# Patient Record
Sex: Male | Born: 1995 | Hispanic: No | Marital: Single | Smoking: Never smoker
Health system: Southern US, Community
[De-identification: ages and names within clinical notes are randomized; demographics above are authoritative.]

---

## 2016-02-15 ENCOUNTER — Encounter (HOSPITAL_COMMUNITY): Payer: Self-pay | Admitting: *Deleted

## 2016-02-15 ENCOUNTER — Emergency Department (HOSPITAL_COMMUNITY)
Admission: EM | Admit: 2016-02-15 | Discharge: 2016-02-15 | Disposition: A | Discharge status: Home / Self Care | Payer: Medicaid Other | Attending: Emergency Medicine | Admitting: Emergency Medicine

## 2016-02-15 ENCOUNTER — Emergency Department (HOSPITAL_COMMUNITY): Payer: Medicaid Other

## 2016-02-15 DIAGNOSIS — M25462 Effusion, left knee: Secondary | ICD-10-CM

## 2016-02-15 DIAGNOSIS — Y929 Unspecified place or not applicable: Secondary | ICD-10-CM | POA: Insufficient documentation

## 2016-02-15 DIAGNOSIS — W2105XA Struck by basketball, initial encounter: Secondary | ICD-10-CM | POA: Diagnosis not present

## 2016-02-15 DIAGNOSIS — Y9367 Activity, basketball: Secondary | ICD-10-CM | POA: Insufficient documentation

## 2016-02-15 DIAGNOSIS — M2392 Unspecified internal derangement of left knee: Secondary | ICD-10-CM | POA: Diagnosis not present

## 2016-02-15 DIAGNOSIS — Y999 Unspecified external cause status: Secondary | ICD-10-CM | POA: Insufficient documentation

## 2016-02-15 DIAGNOSIS — M25562 Pain in left knee: Secondary | ICD-10-CM | POA: Diagnosis present

## 2016-02-15 NOTE — ED Triage Notes (Signed)
The pt is c/o lt knee pain .  He was struck playing basketball 1300 today

## 2016-02-15 NOTE — ED Provider Notes (Signed)
[facility]-EMERGENCY DEPT Provider Note   CSN: [id number] Arrival date & time: 02/15/16  4098  By signing my name below, I, Alyssa Grove, attest that this documentation has been prepared under the direction and in the presence of Zadie Rhine, MD. Electronically Signed: Alyssa Grove, ED Scribe. 02/15/16. 4:07 AM.   History   Chief Complaint Chief Complaint  Patient presents with  . Knee Injury   Pt was playing basketball when he was struck on the lateral side of the left knee when landing. He states he felt a "shock" initially, but was able to walk home. After being seated for an extended period of time, he states left knee pain was exacerbated when attempted to flex or extend the joint. Pt reports associated throbbing left ankle pain with movement of the left ankle. Denies neck pain, back pain, LOC.    Knee Pain   This is a new problem. The current episode started 12 to 24 hours ago. The problem occurs constantly. The pain is present in the left knee. The pain is moderate. There has been a history of trauma (kneed during basketball).    PMH - none Soc hx - college student Home Medications    Prior to Admission medications   Not on File    Family History No family history on file.  Social History Social History  Substance Use Topics  . Smoking status: Never Smoker  . Smokeless tobacco: Never Used  . Alcohol use No    Allergies   Amoxicillin and Penicillins  Review of Systems Review of Systems  Musculoskeletal: Positive for arthralgias and joint swelling. Negative for back pain and neck pain.  Neurological: Negative for syncope.    Physical Exam Updated Vital Signs BP 153/88 (BP Location: Left Arm)   Pulse 70   Temp 98.4 F (36.9 C) (Oral)   Resp 16   SpO2 99%   Physical Exam CONSTITUTIONAL: Well developed/well nourished HEAD: Normocephalic/atraumatic ENMT: Mucous membranes moist NECK: supple no meningeal signs CV: S1/S2 noted, no murmurs/rubs/gallops  noted LUNGS: Lungs are clear to auscultation bilaterally, no apparent distress NEURO: Pt is awake/alert/appropriate, moves all extremitiesx4.  No facial droop.   EXTREMITIES: pulses normal/equal, full ROM, tenderness and effusion noted to left knee with difficulty in flexing left knee, no obvious step offs around the knee, distal pulses intact, no left ankle tenderness SKIN: warm, color normal PSYCH: no abnormalities of mood noted, alert and oriented to situation   ED Treatments / Results  DIAGNOSTIC STUDIES: Oxygen Saturation is 99% on RA, normal by my interpretation.    COORDINATION OF CARE: 3:59 AM Discussed treatment plan with pt at bedside which includes crutches and left knee immobilizer and pt agreed to plan.  Labs (all labs ordered are listed, but only abnormal results are displayed) Labs Reviewed - No data to display  EKG  EKG Interpretation None       Radiology Dg Knee Complete 4 Views Left  Result Date: 02/15/2016 CLINICAL DATA:  Basketball injury EXAM: LEFT KNEE - COMPLETE 4+ VIEW COMPARISON:  None. FINDINGS: There is a large suprapatellar knee effusion. No fracture or dislocation. No evidence of arthrosis. IMPRESSION: Large suprapatellar knee effusion without acute osseous abnormality. Electronically Signed   By: Deatra Robinson M.D.   On: 02/15/2016 03:39    Procedures Procedures (including critical care time) SPLINT APPLICATION Date/Time: 0430 Authorized by: Joya Gaskins Consent: Verbal consent obtained. Risks and benefits: risks, benefits and alternatives were discussed Consent given by: patient Splint applied by: orthopedic technician  Location details: left knee Splint type: immobilizer Supplies used: immobilizer Patient tolerance: Patient tolerated the procedure well with no immediate complications.    Medications Ordered in ED Medications - No data to display   Initial Impression / Assessment and Plan / ED Course  I have reviewed the triage  vital signs and the nursing notes.  Pertinent labs & imaging results that were available during my care of the patient were reviewed by me and considered in my medical decision making (see chart for details).  Clinical Course    No bony injury Suspect internal derangement Referred to ortho and sports medicine Discussed care instructions    Final Clinical Impressions(s) / ED Diagnoses   Final diagnoses:  Effusion of left knee  Internal derangement of left knee    New Prescriptions New Prescriptions   No medications on file   I personally performed the services described in this documentation, which was scribed in my presence. The recorded information has been reviewed and is accurate.        Zadie Rhineonald Imir Brumbach, MD 02/15/16 [phone]

## 2016-02-15 NOTE — Progress Notes (Signed)
**Note James-Identified via Obfuscation** Orthopedic Tech Progress Note Patient Details:  James Oneal 1996-01-18 [id number]  Ortho Devices Type of Ortho Device: Crutches, Knee Immobilizer Ortho Device/Splint Location: lue Ortho Device/Splint Interventions: Ordered, Application   Trinna PostMartinez, Jerzee Jerome J 02/15/2016, 4:34 AM

## 2016-02-15 NOTE — ED Notes (Signed)
Ortho tech will apply knee immobilizer and do crutch training

## 2016-03-17 ENCOUNTER — Encounter (HOSPITAL_COMMUNITY): Payer: Self-pay | Admitting: Emergency Medicine

## 2016-03-17 ENCOUNTER — Emergency Department (HOSPITAL_COMMUNITY)
Admission: EM | Admit: 2016-03-17 | Discharge: 2016-03-17 | Disposition: A | Discharge status: Home / Self Care | Payer: Medicaid Other | Attending: Emergency Medicine | Admitting: Emergency Medicine

## 2016-03-17 DIAGNOSIS — L0291 Cutaneous abscess, unspecified: Secondary | ICD-10-CM

## 2016-03-17 DIAGNOSIS — L02416 Cutaneous abscess of left lower limb: Secondary | ICD-10-CM | POA: Insufficient documentation

## 2016-03-17 DIAGNOSIS — L02413 Cutaneous abscess of right upper limb: Secondary | ICD-10-CM | POA: Diagnosis not present

## 2016-03-17 MED ORDER — SULFAMETHOXAZOLE-TRIMETHOPRIM 800-160 MG PO TABS: 1.0000 | ORAL_TABLET | Freq: Two times a day (BID) | ORAL | 0 refills | Status: AC

## 2016-03-17 NOTE — ED Provider Notes (Signed)
[facility]-EMERGENCY DEPT Provider Note   CSN: [id number] Arrival date & time: 03/17/16  1109  By signing my name below, I, Marnette Burgess Long, attest that this documentation has been prepared under the direction and in the presence of [address] [employer]. Electronically Signed: Marnette Burgess Long, Scribe. 03/17/2016. 12:09 PM.  History   Chief Complaint Chief Complaint  Patient presents with  . Abscess   The history is provided by the patient and medical records. No language interpreter was used.  Abscess  Location:  Shoulder/arm and pelvis Shoulder/arm abscess location:  R shoulder Pelvic abscess location:  Groin Abscess quality: draining, painful and redness   Abscess quality: no warmth   Abscess quality comment:  Right shoulder +drainage, Left groin no drainage Red streaking: no   Duration:  4 days Progression:  Unchanged Pain details:    Quality:  Aching   Severity:  Moderate   Duration:  4 days   Timing:  Constant   Progression:  Unchanged Chronicity:  New Context: skin injury (shaved)   Context: not immunosuppression   Relieved by:  None tried Worsened by:  Warm compresses Ineffective treatments:  None tried Associated symptoms: no fever, no nausea and no vomiting   Risk factors: no prior abscess     HPI Comments: James Oneal is a 21 y.o. male with no pertinent PMHx, who presents to the Emergency Department complaining of a moderate gradually worsening abscess to his R shoulder and L groin/hip x4 days. States that the area on his R shoulder is already draining but the L groin/hip one is not draining. Describes the pain on these areas as 5/10 constant, aching, non-radiating pain in the areas, worse with applying heat or palpation to the areas, and with no tx tried or alleviating factors noted. Associated symptoms includes erythema and swelling to the areas, and drainage from the R shoulder one. He has no h/o abscesses prior to the current areas of swelling.  He is not  immunosuppressed. States that he shaved and believes that could have caused them, but denies any other known injuries to the areas. He also denies warmth of the areas, red streaking, fevers, chills, CP, SOB, abd pain, N/V/D/C, hematuria, dysuria, myalgias, arthralgias, numbness, tingling, focal weakness, or any other complaints at this time.    History reviewed. No pertinent past medical history.  There are no active problems to display for this patient.   History reviewed. No pertinent surgical history.     Home Medications    Prior to Admission medications   Not on File    Family History No family history on file.  Social History Social History  Substance Use Topics  . Smoking status: Never Smoker  . Smokeless tobacco: Never Used  . Alcohol use No     Allergies   Amoxicillin and Penicillins   Review of Systems Review of Systems  Constitutional: Negative for chills and fever.  Respiratory: Negative for shortness of breath.   Cardiovascular: Negative for chest pain.  Gastrointestinal: Negative for abdominal pain, constipation, diarrhea, nausea and vomiting.  Genitourinary: Negative for dysuria and hematuria.  Musculoskeletal: Negative for arthralgias and myalgias.  Skin: Positive for color change. Negative for rash.  Allergic/Immunologic: Negative for immunocompromised state.  Neurological: Negative for weakness and numbness.  Psychiatric/Behavioral: Negative for confusion.   10 Systems reviewed and are negative for acute change except as noted in the HPI.   Physical Exam Updated Vital Signs BP 112/78 (BP Location: Left Arm)   Pulse 73  Temp 97.9 F (36.6 C) (Oral)   Resp 16   Ht 5\' 8"  (1.727 m)   Wt 145 lb (65.8 kg)   SpO2 96%   BMI 22.05 kg/m   Physical Exam  Constitutional: He is oriented to person, place, and time. Vital signs are normal. He appears well-developed and well-nourished.  Non-toxic appearance. No distress.  Afebrile, nontoxic, NAD    HENT:  Head: Normocephalic and atraumatic.  Mouth/Throat: Mucous membranes are normal.  Eyes: Conjunctivae and EOM are normal. Right eye exhibits no discharge. Left eye exhibits no discharge.  Neck: Normal range of motion. Neck supple.  Cardiovascular: Normal rate and intact distal pulses.   Pulmonary/Chest: Effort normal. No respiratory distress.  Abdominal: Normal appearance. He exhibits no distension.  Musculoskeletal: Normal range of motion.  Neurological: He is alert and oriented to person, place, and time. He has normal strength. No sensory deficit.  Skin: Skin is warm, dry and intact. No rash noted. There is erythema.  Right shoulder with ~1cm fluctuant draining abscess with adequate purulent drainage expressed easily, mildly erythematous without red streaking, no surrounding induration or cellulitis. Left anterior hip with ~1cm fluctuant abscess, mildly erythematous without red streaking, no surrounding induration or cellulitis, no drainage. Both areas mildly warm to the touch.  Psychiatric: He has a normal mood and affect.  Nursing note and vitals reviewed.    ED Treatments / Results  DIAGNOSTIC STUDIES:  Oxygen Saturation is 96% on RA, adequate by my interpretation.    COORDINATION OF CARE:  12:02 PM Discussed treatment plan with pt at bedside including I&D and Bactrim and pt agreed to plan. Pt advised to f/u with [facility] clinic in three days.   Labs (all labs ordered are listed, but only abnormal results are displayed) Labs Reviewed - No data to display  EKG  EKG Interpretation None       Radiology No results found.  Procedures .Marland KitchenIncision and Drainage Date/Time: 03/17/2016 12:04 PM Performed by: Rhona Raider Authorized by: Rhona Raider   Consent:    Consent obtained:  Verbal   Consent given by:  Patient   Risks discussed:  Bleeding and pain   Alternatives discussed:  Alternative treatment, observation and delayed treatment Universal protocol:     Procedure explained and questions answered to patient or proxy's satisfaction: yes     Site/side marked: yes     Patient identity confirmed:  Verbally with patient Location:    Type:  Abscess   Size:  1cm   Location:  Lower extremity   Lower extremity location:  Hip   Hip location:  L hip Anesthesia (see MAR for exact dosages):    Anesthesia method:  Topical application   Topical anesthesia: Pain Ease spray. Procedure type:    Complexity:  Simple Procedure details:    Needle aspiration: no     Incision types:  Stab incision   Incision depth:  Subcutaneous   Scalpel blade:  11   Drainage:  Bloody   Drainage amount:  Scant   Wound treatment:  Wound left open   Packing materials:  None Post-procedure details:    Patient tolerance of procedure:  Tolerated well, no immediate complications     MALE CHAPERONE PRESENT FOR I&D.  Medications Ordered in ED Medications - No data to display   Initial Impression / Assessment and Plan / ED Course  I have reviewed the triage vital signs and the nursing notes.  Pertinent labs & imaging results that were available during my care  of the patient were reviewed by me and considered in my medical decision making (see chart for details).     21 y.o. male here with abscess to R shoulder that's already draining, and L anterior hip that is fluctuant. Both areas with erythema and warmth, but no spreading cellulitis. L anterior hip abscess drained with scant purulent bloody drainage expressed. R shoulder abscess adequately draining without I&D. Will start on abx, advised wound care and warm compresses, tylenol/motrin for pain. F/up with campus clinic or [facility] in 2-3 days for recheck. I explained the diagnosis and have given explicit precautions to return to the ER including for any other new or worsening symptoms. The patient understands and accepts the medical plan as it's been dictated and I have answered their questions. Discharge instructions  concerning home care and prescriptions have been given. The patient is STABLE and is discharged to home in good condition.   I personally performed the services described in this documentation, which was scribed in my presence. The recorded information has been reviewed and is accurate.   Final Clinical Impressions(s) / ED Diagnoses   Final diagnoses:  Abscess    New Prescriptions New Prescriptions   SULFAMETHOXAZOLE-TRIMETHOPRIM (BACTRIM DS,SEPTRA DS) 800-160 MG TABLET    Take 1 tablet by mouth 2 (two) times daily.       [address]Ellianna Ruest, PA-C 03/17/16 1547    Tilden FossaElizabeth Rees, MD 03/18/16 [phone]

## 2016-03-17 NOTE — ED Triage Notes (Signed)
Pt c/o right shoulder abscess and left groin abscess onset of Sunday. Pt reports popping one on shoulder and has been draining since.

## 2016-03-17 NOTE — Discharge Instructions (Signed)
Keep wound clean and dry. Apply warm compresses (get a wash cloth wet, place in microwave for ~15 seconds, then place into ziploc bag and apply to area) to affected area throughout the day, at least 2x/day. Take antibiotic until it is finished. Alternate between tylenol and motrin as needed for pain. [facility] in 2-3 days for wound recheck. Monitor area for signs of infection to include, but not limited to: increasing pain, spreading redness, drainage/pus, worsening swelling, or fevers. Return to emergency department for emergent changing or worsening symptoms.

## 2018-09-23 IMAGING — CR DG KNEE COMPLETE 4+V*L*
4 series · 4 of 4 positions shown · non-contrast
Comparison: None.

CLINICAL DATA: Basketball injury

EXAM:
LEFT KNEE - COMPLETE 4+ VIEW

[knee ap]
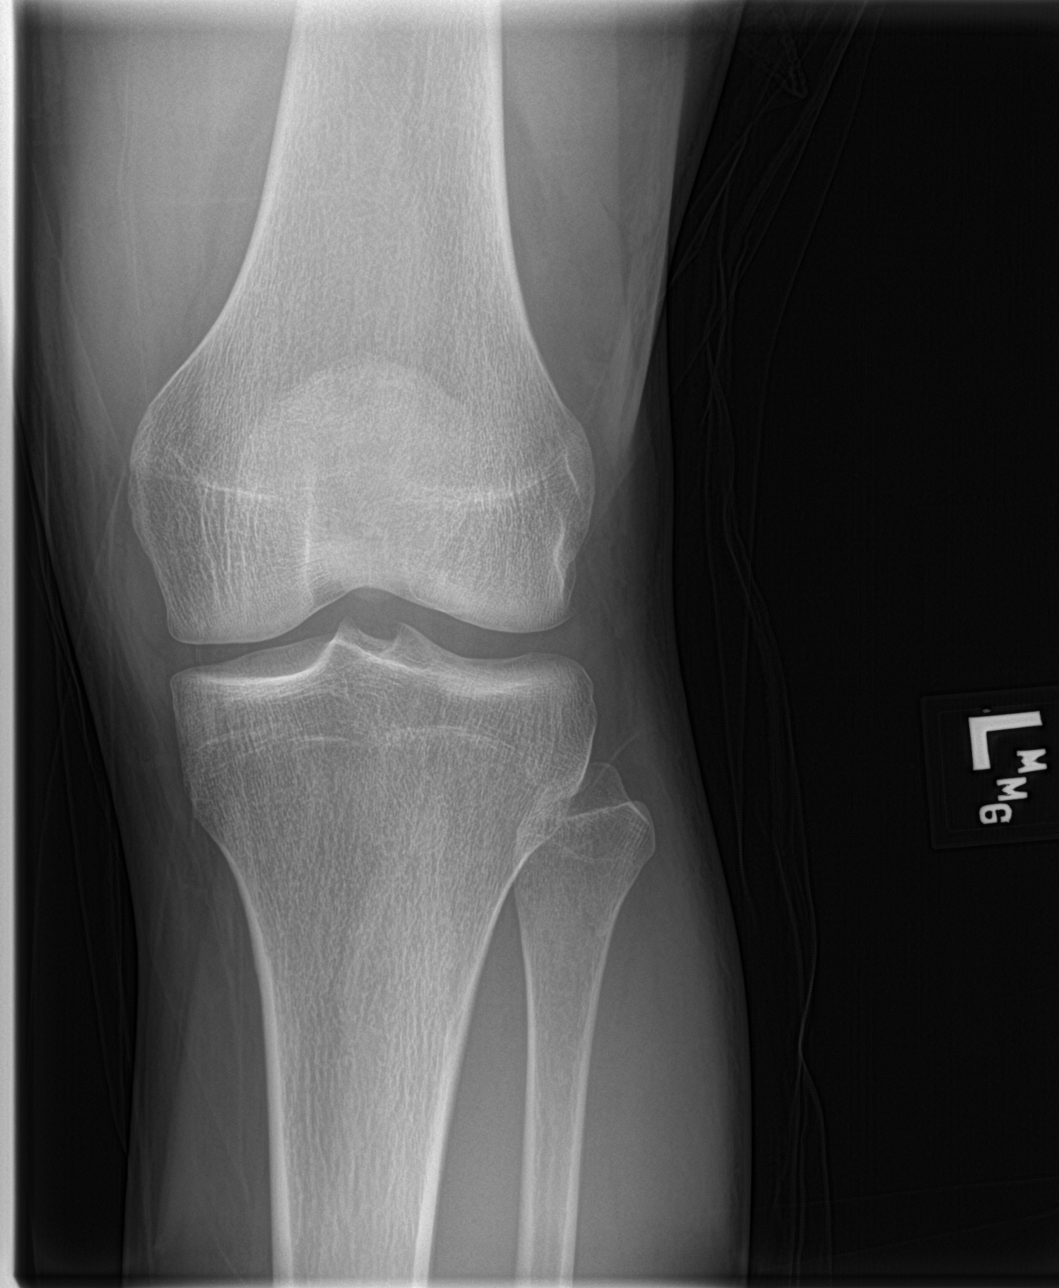

[knee lat]
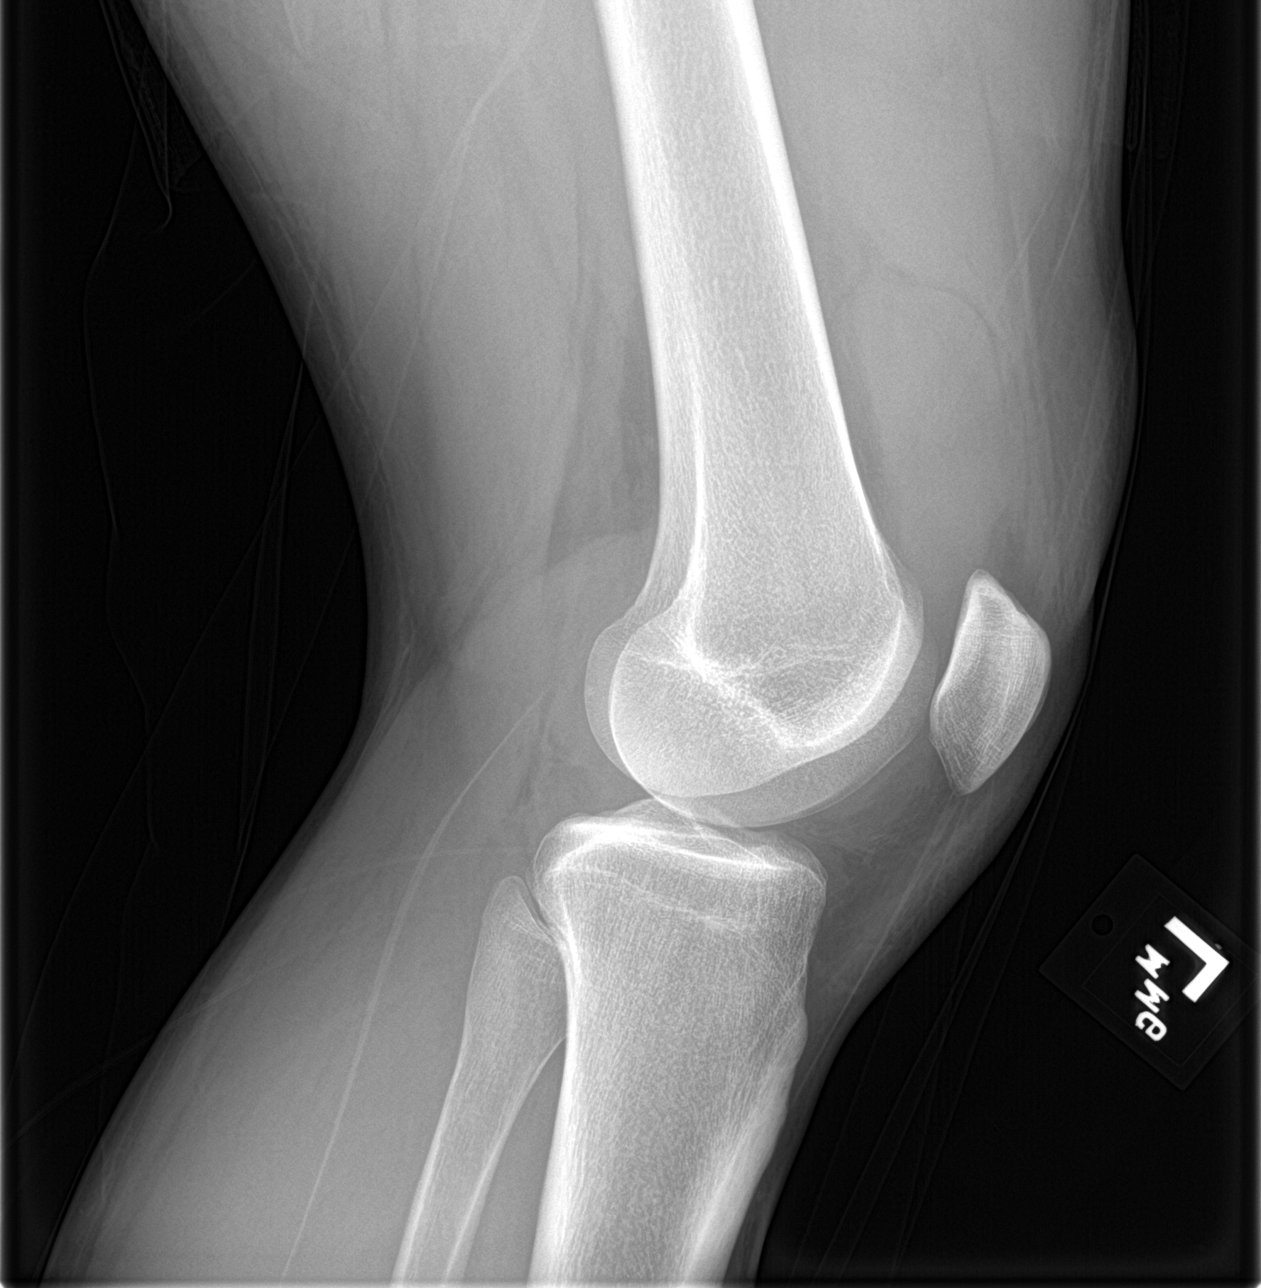

[knee obl (1 of 2)]
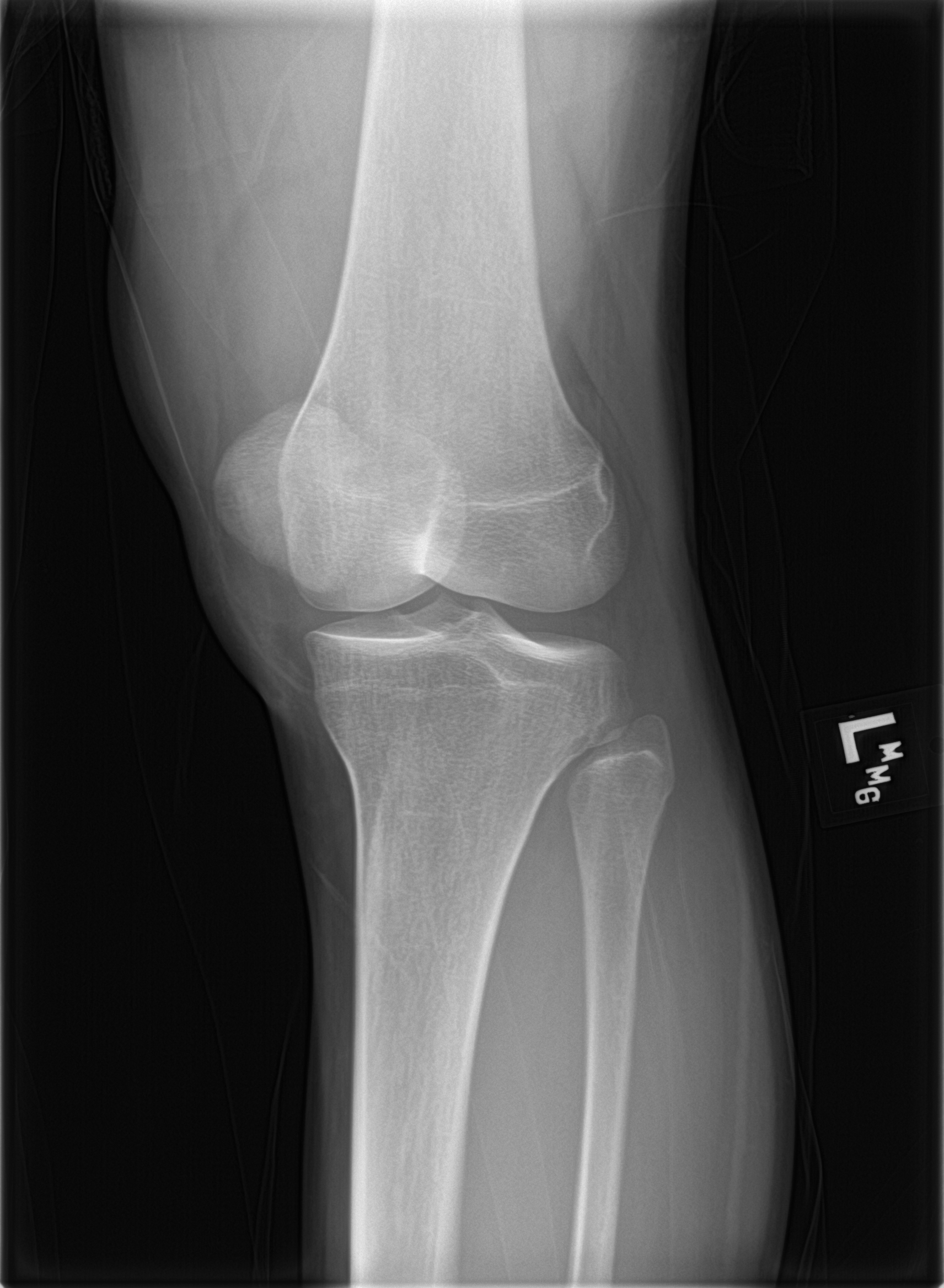

[knee obl (2 of 2)]
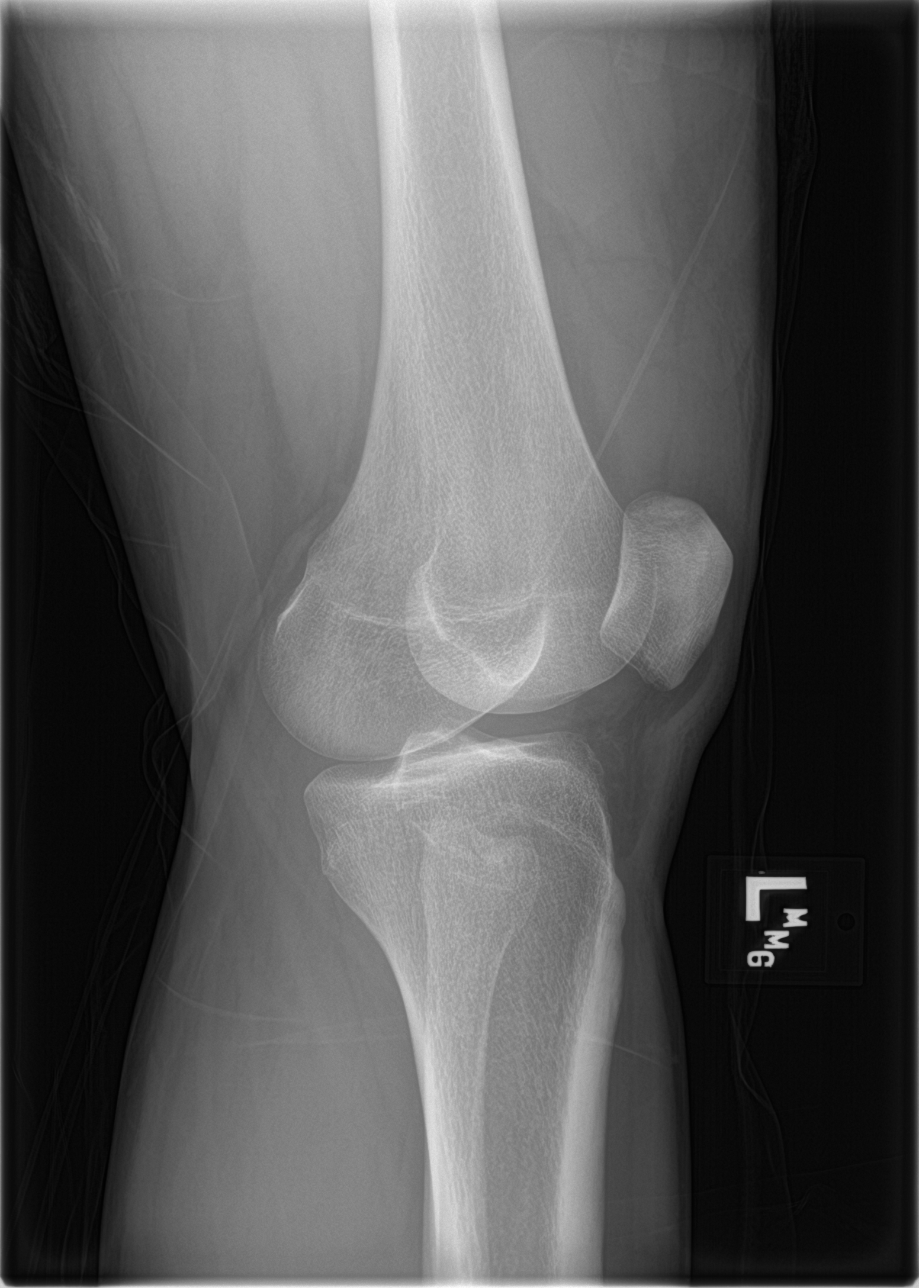

[4 of 4 positions shown; findings below may reference images not displayed]

FINDINGS: There is a large suprapatellar knee effusion. No fracture or
dislocation. No evidence of arthrosis.
IMPRESSION: Large suprapatellar knee effusion without acute osseous abnormality.
